# Patient Record
Sex: Female | Born: 1955 | Race: White | Hispanic: No | Marital: Married | Smoking: Never smoker
Health system: Southern US, Community
[De-identification: ages and names within clinical notes are randomized; demographics above are authoritative.]

## PROBLEM LIST (undated history)

## (undated) DIAGNOSIS — E7800 Pure hypercholesterolemia, unspecified: Secondary | ICD-10-CM

## (undated) DIAGNOSIS — K649 Unspecified hemorrhoids: Secondary | ICD-10-CM

## (undated) DIAGNOSIS — E039 Hypothyroidism, unspecified: Secondary | ICD-10-CM

## (undated) HISTORY — PX: CHOLECYSTECTOMY: SHX55

## (undated) HISTORY — DX: Hypothyroidism, unspecified: E03.9

## (undated) HISTORY — DX: Unspecified hemorrhoids: K64.9

## (undated) HISTORY — DX: Pure hypercholesterolemia, unspecified: E78.00

## (undated) HISTORY — PX: TUBAL LIGATION: SHX77

---

## 1998-05-31 ENCOUNTER — Other Ambulatory Visit: Admission: RE | Admit: 1998-05-31 | Discharge: 1998-05-31 | Payer: Self-pay | Admitting: Obstetrics and Gynecology

## 1998-11-22 ENCOUNTER — Other Ambulatory Visit: Admission: RE | Admit: 1998-11-22 | Discharge: 1998-11-22 | Payer: Self-pay | Admitting: Obstetrics and Gynecology

## 1999-08-18 ENCOUNTER — Ambulatory Visit (HOSPITAL_COMMUNITY): Admission: RE | Admit: 1999-08-18 | Discharge: 1999-08-18 | Payer: Self-pay | Admitting: Family Medicine

## 2000-11-25 ENCOUNTER — Other Ambulatory Visit: Admission: RE | Admit: 2000-11-25 | Discharge: 2000-11-25 | Payer: Self-pay | Admitting: Obstetrics and Gynecology

## 2002-01-29 ENCOUNTER — Other Ambulatory Visit: Admission: RE | Admit: 2002-01-29 | Discharge: 2002-01-29 | Payer: Self-pay | Admitting: Obstetrics and Gynecology

## 2003-02-01 ENCOUNTER — Other Ambulatory Visit: Admission: RE | Admit: 2003-02-01 | Discharge: 2003-02-01 | Payer: Self-pay | Admitting: Obstetrics and Gynecology

## 2003-03-03 ENCOUNTER — Encounter: Payer: Self-pay | Admitting: Family Medicine

## 2003-03-03 ENCOUNTER — Ambulatory Visit (HOSPITAL_COMMUNITY): Admission: RE | Admit: 2003-03-03 | Discharge: 2003-03-03 | Payer: Self-pay | Admitting: Family Medicine

## 2004-02-03 ENCOUNTER — Other Ambulatory Visit: Admission: RE | Admit: 2004-02-03 | Discharge: 2004-02-03 | Payer: Self-pay | Admitting: Obstetrics and Gynecology

## 2005-02-08 ENCOUNTER — Other Ambulatory Visit: Admission: RE | Admit: 2005-02-08 | Discharge: 2005-02-08 | Payer: Self-pay | Admitting: Obstetrics and Gynecology

## 2006-02-22 ENCOUNTER — Encounter: Admission: RE | Admit: 2006-02-22 | Discharge: 2006-02-22 | Payer: Self-pay | Admitting: Obstetrics and Gynecology

## 2010-03-06 ENCOUNTER — Ambulatory Visit (HOSPITAL_COMMUNITY): Admission: RE | Admit: 2010-03-06 | Discharge: 2010-03-06 | Payer: Self-pay | Admitting: Obstetrics and Gynecology

## 2011-03-12 LAB — CBC
HCT: 41.8 % (ref 36.0–46.0)
Hemoglobin: 14 g/dL (ref 12.0–15.0)
MCHC: 33.5 g/dL (ref 30.0–36.0)
MCV: 88 fL (ref 78.0–100.0)
Platelets: 228 10*3/uL (ref 150–400)
RBC: 4.75 MIL/uL (ref 3.87–5.11)
RDW: 12.7 % (ref 11.5–15.5)
WBC: 7.5 10*3/uL (ref 4.0–10.5)

## 2011-04-23 ENCOUNTER — Other Ambulatory Visit: Payer: Self-pay | Admitting: Obstetrics and Gynecology

## 2012-05-08 ENCOUNTER — Other Ambulatory Visit: Payer: Self-pay | Admitting: Obstetrics and Gynecology

## 2013-05-13 ENCOUNTER — Other Ambulatory Visit: Payer: Self-pay | Admitting: Obstetrics and Gynecology

## 2014-06-08 ENCOUNTER — Other Ambulatory Visit: Payer: Self-pay | Admitting: Obstetrics and Gynecology

## 2014-06-10 LAB — CYTOLOGY - PAP

## 2015-06-14 ENCOUNTER — Other Ambulatory Visit: Payer: Self-pay | Admitting: Obstetrics and Gynecology

## 2015-06-16 LAB — CYTOLOGY - PAP

## 2016-07-23 ENCOUNTER — Other Ambulatory Visit: Payer: Self-pay | Admitting: Obstetrics and Gynecology

## 2016-07-24 LAB — CYTOLOGY - PAP

## 2017-12-13 ENCOUNTER — Encounter: Payer: Self-pay | Admitting: Gynecologic Oncology

## 2017-12-13 ENCOUNTER — Ambulatory Visit: Payer: No Typology Code available for payment source | Attending: Gynecologic Oncology | Admitting: Gynecologic Oncology

## 2017-12-13 VITALS — BP 154/72 | HR 74 | Temp 98.6°F | Resp 16 | Ht 62.0 in | Wt 217.1 lb

## 2017-12-13 DIAGNOSIS — E669 Obesity, unspecified: Secondary | ICD-10-CM | POA: Diagnosis not present

## 2017-12-13 DIAGNOSIS — Z6841 Body Mass Index (BMI) 40.0 and over, adult: Secondary | ICD-10-CM

## 2017-12-13 DIAGNOSIS — E039 Hypothyroidism, unspecified: Secondary | ICD-10-CM | POA: Insufficient documentation

## 2017-12-13 DIAGNOSIS — E7800 Pure hypercholesterolemia, unspecified: Secondary | ICD-10-CM | POA: Insufficient documentation

## 2017-12-13 DIAGNOSIS — N8501 Benign endometrial hyperplasia: Secondary | ICD-10-CM

## 2017-12-13 DIAGNOSIS — K649 Unspecified hemorrhoids: Secondary | ICD-10-CM | POA: Insufficient documentation

## 2017-12-13 NOTE — Progress Notes (Signed)
Consult Note: Gyn-Onc  Consult was requested by Dr. Dareen PianoAnderson for the evaluation of Wynona CanesChristine A Herschberger 61 y.o. female  CC:  Chief Complaint  Patient presents with  . Endometrial Hyperplasia    New patient    Assessment/Plan:  Ms. Delorse LimberChristine A Shavers  is a 61 y.o.  year old with "benign" endometrial hyperplasia and obesity (BMI 40).   I discussed that complex or simple hyperplasia without atypia carry a less than 3% risk for progression to malignancy, and therefore, in the absence of symptoms, no intervention is necessary at this time. It is reasonable for her to sait until her symptoms of bleeding return and then repeat endometrial sampling at that time.  If this again shows endometrial hyperplasia I would recommend a trial of progestin therapy either orally with 80 mg of Megace twice daily or preferably placement of a progestin releasing IUD.  If she fails medical management with progestins she would be a candidate for hysterectomy.  I discussed this is the most definitive route of therapy but also carries the greatest risk particularly in women who are morbidly obese.  I discussed that the underlying etiology of her hyperplasia and abnormal uterine bleeding is her obesity.  We discussed options for weight loss including dietary options and consideration of bariatric surgery.  We have not scheduled her for follow-up at this time.  She will follow-up with either me or Dr. Dareen PianoAnderson if she develops repeat bleeding.  HPI: Darci CurrentChristine Baley is a 61 year old parous woman who was seen in consultation at the request of Dr. Dareen PianoAnderson for endometrial hyperplasia without atypia.  The patient is a morbidly obese (40 kg/m) woman who has a history of intermittent postmenopausal bleeding for the past 10 years.  She began reporting this and having it worked up in the last 12 months.  Transvaginal ultrasound scan on August 03, 2017 showed an anteverted uterus measuring 7 cm in greatest dimension with an  irregular appearance and echogenic anterior wall with a thickened endometrium.  She was then offered a office procedure of removal of endometrial polyps and biopsies with ECC on November 12, 2017 which showed on the endocervical curettings benign endometrial hyperplasia and the previous month on October 03, 2017 and endometrial biopsy showing disordered proliferative phase endometrium with focal features suggestive of polyp no atypical hyperplasia or carcinoma.  Since the office procedure to remove the polyps she has had no further bleeding.   She has hypothyroidism, elevated cholesterol and BP, and borderline diabetes.  She has had 3 SVD's, a tubal ligation and a cholecystectomy.  Current Meds:  Outpatient Encounter Medications as of 12/13/2017  Medication Sig  . atorvastatin (LIPITOR) 20 MG tablet atorvastatin 20 mg tablet  TAKE ONE TABLET BY MOUTH DAILY  . levothyroxine (SYNTHROID, LEVOTHROID) 150 MCG tablet levothyroxine 150 mcg tablet  TAKE ONE TABLET BY MOUTH DAILY  . [DISCONTINUED] aspirin 81 MG tablet aspirin 81 mg tablet  Take by oral route.   No facility-administered encounter medications on file as of 12/13/2017.     Allergy:  Allergies  Allergen Reactions  . Sulfa Antibiotics Hives  . Sulfamethoxazole-Trimethoprim Other (See Comments)    As a child not sure her reaction     Social Hx:   Social History   Socioeconomic History  . Marital status: Married    Spouse name: Not on file  . Number of children: Not on file  . Years of education: Not on file  . Highest education level: Not on file  Social Needs  .  Financial resource strain: Not on file  . Food insecurity - worry: Not on file  . Food insecurity - inability: Not on file  . Transportation needs - medical: Not on file  . Transportation needs - non-medical: Not on file  Occupational History  . Not on file  Tobacco Use  . Smoking status: Never Smoker  . Smokeless tobacco: Never Used  Substance and Sexual  Activity  . Alcohol use: Yes    Comment: occas  . Drug use: No  . Sexual activity: Yes  Other Topics Concern  . Not on file  Social History Narrative  . Not on file    Past Surgical Hx:  Past Surgical History:  Procedure Laterality Date  . CHOLECYSTECTOMY    . TUBAL LIGATION      Past Medical Hx:  Past Medical History:  Diagnosis Date  . Hemorrhoids   . Hypercholesterolemia   . Hypothyroid     Past Gynecological History:  SVD x 3 No LMP recorded.  Family Hx:  Family History  Problem Relation Age of Onset  . Colon cancer Mother   . Prostate cancer Father   . Colon cancer Paternal Aunt   . Liver cancer Maternal Grandfather   . Diabetes Paternal Grandmother     Review of Systems:  Constitutional  Feels well,    ENT Normal appearing ears and nares bilaterally Skin/Breast  No rash, sores, jaundice, itching, dryness Cardiovascular  No chest pain, shortness of breath, or edema  Pulmonary  No cough or wheeze.  Gastro Intestinal  No nausea, vomitting, or diarrhoea. No bright red blood per rectum, no abdominal pain, change in bowel movement, or constipation.  Genito Urinary  No frequency, urgency, dysuria, + postmenopausal bleeding Musculo Skeletal  No myalgia, arthralgia, joint swelling or pain  Neurologic  No weakness, numbness, change in gait,  Psychology  No depression, anxiety, insomnia.   Vitals:  Blood pressure (!) 154/72, pulse 74, temperature 98.6 F (37 C), temperature source Oral, resp. rate 16, height 5\' 2"  (1.575 m), weight 217 lb 1.6 oz (98.5 kg), SpO2 100 %.  Physical Exam: WD in NAD Neck  Supple NROM, without any enlargements.  Lymph Node Survey No cervical supraclavicular or inguinal adenopathy Cardiovascular  Pulse normal rate, regularity and rhythm. S1 and S2 normal.  Lungs  Clear to auscultation bilateraly, without wheezes/crackles/rhonchi. Good air movement.  Skin  No rash/lesions/breakdown  Psychiatry  Alert and oriented to  person, place, and time  Abdomen  Normoactive bowel sounds, abdomen soft, non-tender and obese without evidence of hernia.  Back No CVA tenderness Genito Urinary  Vulva/vagina: Normal external female genitalia.   No lesions. No discharge or bleeding.  Bladder/urethra:  No lesions or masses, well supported bladder  Vagina: normal, no blood  Cervix: Normal appearing, no lesions.  Uterus:  Small, mobile, no parametrial involvement or nodularity.  Adnexa: no palpable masses. Rectal  deferred Extremities  No bilateral cyanosis, clubbing or edema.   Quinn Axeossi, Crysten Kaman Caroline, MD  12/13/2017, 1:49 PM

## 2017-12-13 NOTE — Patient Instructions (Signed)
No intervention recommended at this time given that you don't have bleeding symptoms. Weight loss will help prevent repeated hyperplasia and reduce the risk of cancer formation.  If you develop new vaginal bleeding please see Dr Dareen PianoAnderson or Dr Andrey Farmerossi and have repeated sampling of the uterus. If the hyperplasia has returned, you should trial progestin therapy either as a tablet or IUD. If this does not improve the bleeding, you would be considered for hysterectomy.  Dr Oliver Humossi's office number is [phone].

## 2018-01-24 ENCOUNTER — Telehealth: Payer: Self-pay | Admitting: *Deleted

## 2018-01-24 NOTE — Telephone Encounter (Signed)
[facility] have faxed the 12/28 office note to Tia.

## 2018-03-17 HISTORY — PX: DILATION AND CURETTAGE, DIAGNOSTIC / THERAPEUTIC: SUR384

## 2018-04-29 ENCOUNTER — Other Ambulatory Visit: Payer: Self-pay | Admitting: Obstetrics and Gynecology

## 2018-05-07 ENCOUNTER — Other Ambulatory Visit: Payer: Self-pay | Admitting: Obstetrics and Gynecology

## 2018-05-13 ENCOUNTER — Encounter (HOSPITAL_BASED_OUTPATIENT_CLINIC_OR_DEPARTMENT_OTHER): Payer: Self-pay

## 2018-05-13 ENCOUNTER — Ambulatory Visit (HOSPITAL_BASED_OUTPATIENT_CLINIC_OR_DEPARTMENT_OTHER): Admit: 2018-05-13 | Payer: No Typology Code available for payment source | Admitting: Obstetrics and Gynecology

## 2018-05-13 SURGERY — DILATATION AND CURETTAGE /HYSTEROSCOPY
Anesthesia: Choice

## 2018-05-19 ENCOUNTER — Telehealth: Payer: Self-pay | Admitting: *Deleted

## 2018-05-19 NOTE — Telephone Encounter (Signed)
[facility] and scheduled a follow up appt for the patient. Patient had d&c done on 5/22 still having bleeding, benign results. Dr.Phelps to see the June 26th at 10:45am, patient on a cruise from 6/4 thru 6/22.

## 2018-06-10 NOTE — Progress Notes (Signed)
Progress Note: GYN-ONC Established Patient Follow-up  Consult was initally requested by Dr. Dareen Piano  CC:  Chief Complaint  Patient presents with  . Benign endometrial hyperplasia    HPI Theresa Castaneda  is a 62 y.o. year old with "benign" endometrial hyperplasia and obesity (BMI 40).  Interval History Since her last visit with Dr. Andrey Farmer she had stopped bleeding for about 6 months but then had ~1.5 months of bleeding with some days as heavy as a menstrual cycle. Dr. Dareen Piano attempted EMB in office but it sounds like it showed inadequate tissue. She was then taken for [facility] D&C 05/07/18 and findings showed some anterior projections into the cavity possibly fibroids, but Dr. Dareen Piano felt the tissue adequacy was suboptimal. The pathology did show secretory endometrium and some areas suggestive of  hyperplasia, due to "squamous morules"; however extensive fragmentation and breakdown prevented accurate assessment for such. There are no atypia or malignant features.  Given her prior episode of bleeding and the concern as to the etiology of the anterior lesions she was sent for recommendations. Dr Dareen Piano is concerned she may be difficult to sample in the office going forward.  She has intentionally lost 7 pounds since her visit with Dr. Andrey Farmer 11/2017. She is walking more now that she is retired. Her and her husband are trying to incorporate more routine physical activity.     Presenting History The patient is a morbidly obese (40 kg/m) woman who has a history of intermittent postmenopausal bleeding for the past 10 years.  She began reporting this and having it worked up a year prior to presenting to [facility]  11/2017. Transvaginal ultrasound scan on August 03, 2017 showed an anteverted uterus measuring 7 cm in greatest dimension with an irregular appearance and echogenic anterior wall with a thickened endometrium.  October 03, 2017 an endometrial biopsy showed disordered proliferative  phase endometrium with focal features suggestive of polyp no atypical hyperplasia or carcinoma. She was then offered a office procedure of removal of endometrial polyps and biopsies with ECC on November 12, 2017 which showed on the endocervical curettings benign endometrial hyperplasia   Dr. Andrey Farmer discussed that complex or simple hyperplasia without atypia carry a less than 3% risk for progression to malignancy, and therefore, in the absence of symptoms, no intervention was necessary.  Plan was if symptoms of bleeding returned then repeat endometrial sampling. If this again this had shown endometrial hyperplasia recommendation was for a trial of progestin therapy either orally with 80 mg of Megace twice daily or preferably placement of a progestin releasing IUD.  If she failed medical management with progestins she would be a candidate for hysterectomy.    Dr. Andrey Farmer specifically discussed the underlying etiology of her hyperplasia and abnormal uterine bleeding is her obesity.  She reviewed options for weight loss including dietary options and consideration of bariatric surgery.  She was left with open followup in the event she developed further bleeding that required our input.    She has hypothyroidism, elevated cholesterol and BP, and borderline diabetes.  She has had 3 SVD's, a tubal ligation and a cholecystectomy.  Current Meds:  Outpatient Encounter Medications as of 06/11/2018  Medication Sig  . atorvastatin (LIPITOR) 20 MG tablet atorvastatin 20 mg tablet  TAKE ONE TABLET BY MOUTH DAILY  . levothyroxine (SYNTHROID, LEVOTHROID) 150 MCG tablet levothyroxine 150 mcg tablet  TAKE ONE TABLET BY MOUTH DAILY  . naproxen (NAPROSYN) 500 MG tablet Take 500 mg by mouth 2 (two) times daily  with a meal.   No facility-administered encounter medications on file as of 06/11/2018.     Allergy:  Allergies  Allergen Reactions  . Sulfa Antibiotics Hives  . Sulfamethoxazole-Trimethoprim Other (See  Comments)    As a child not sure her reaction     Social Hx:   Tobacco use: None ETOH use : occasional Illicit Drug use: None H/o IV Drug use: None  Past Surgical Hx:  Past Surgical History:  Procedure Laterality Date  . CHOLECYSTECTOMY    . DILATION AND CURETTAGE, DIAGNOSTIC / THERAPEUTIC  03/2018  . TUBAL LIGATION      Past Medical Hx:  Past Medical History:  Diagnosis Date  . Hemorrhoids   . Hypercholesterolemia   . Hypothyroid     Past Gynecological History:  SVD x 3 No LMP recorded.  Family Hx:  Family History  Problem Relation Age of Onset  . Colon cancer Mother   . Prostate cancer Father   . Colon cancer Paternal Aunt   . Liver cancer Maternal Grandfather   . Diabetes Paternal Grandmother     Review of Systems: Review of Systems  Endocrine: Positive for hot flashes.  Genitourinary: Positive for vaginal bleeding.   All other systems reviewed and are negative.    Vitals:  Blood pressure 135/67, pulse 83, temperature 98.4 F (36.9 C), temperature source Oral, resp. rate 18, height 5\' 2"  (1.575 m), weight 210 lb 14.4 oz (95.7 kg), SpO2 100 %. Body mass index is 38.57 kg/m.   Physical Exam: ECOG PERFORMANCE STATUS: 1 - Symptomatic but completely ambulatory   General :  Well developed, 62 y.o., female in no apparent distress HEENT:  Normocephalic/atraumatic, symmetric, EOMI, eyelids normal Neck:   No visible masses.  Respiratory:  Respirations unlabored, no use of accessory muscles CV:   Deferred Breast:  Deferred Musculoskeletal: Normal muscle strength. Abdomen:  No visible masses or protrusion Extremities:  No visible edema or deformities Skin:   Normal inspection Neuro/Psych:  No focal motor deficit, no abnormal mental status. Normal gait. Normal affect. Alert and oriented to person, place, and time  Pelvic:  Deferred by patient request.   ASSESSMENT / PLAN 1. Postmenopausal bleeding with prior simple hyperplasia, now showing secretory  endometrium  Still would recommend progesterone. Favor IUD if she can afford it.  Discussed risks of oral progesterone, but given this was simple hyperplasia before and now reading as secretory could use lower dose progesterone options  I would not be concerned about trying to resample her once the IUD is placed  Discussed hysterectomy. Patient trying to avoid surgery. 2. Obesity  Re-reviewed this is the underlying issue that she needs to work on.  I encouraged her to continue her efforts at weight loss.  She does not want bariatric surgery 3. Anterior projections on [facility]  May be small fibroids, but I think Dr. Dareen Piano sampled sufficiently to rule out endometrial pathology  If bleeding with IUD we might need to consider etiology other than fibroids. 4. I explained the above recommendations and thoughts to the patient's satisfaction. 5. Return if bleeding once IUD has been in place for reasonable amount of time. 6. I will copy Dr. Andrey Farmer as she had originally seen the patient and there is a personal connection the patient mentioned to me today. 7. I'll need to call Dr. Dareen Piano to review above.  Face to face time with patient was 30 minutes. Over 50% of this time was spent on counseling and coordination of care.   Artist Pais  Waynetta SandyB Jaisha Villacres, MD  06/11/2018, 1:39 PM  Cc: Malva LimesMark Anderson, MD (Referring Ob/Gyn) Arvella MerlesW. Randall Harris, MD (PCP)

## 2018-06-11 ENCOUNTER — Inpatient Hospital Stay: Payer: No Typology Code available for payment source | Attending: Obstetrics | Admitting: Obstetrics

## 2018-06-11 ENCOUNTER — Encounter: Payer: Self-pay | Admitting: Obstetrics

## 2018-06-11 VITALS — BP 135/67 | HR 83 | Temp 98.4°F | Resp 18 | Ht 62.0 in | Wt 210.9 lb

## 2018-06-11 DIAGNOSIS — N8501 Benign endometrial hyperplasia: Secondary | ICD-10-CM

## 2018-06-11 DIAGNOSIS — Z6841 Body Mass Index (BMI) 40.0 and over, adult: Secondary | ICD-10-CM | POA: Insufficient documentation

## 2018-06-11 DIAGNOSIS — E669 Obesity, unspecified: Secondary | ICD-10-CM

## 2018-06-11 NOTE — Patient Instructions (Signed)
1. Open followup should you desire. 2. I will call Dr. Dareen PianoAnderson with recommendations. 3. Continue weight loss efforts.

## 2018-08-11 ENCOUNTER — Other Ambulatory Visit: Payer: Self-pay | Admitting: Obstetrics and Gynecology

## 2018-08-11 DIAGNOSIS — R928 Other abnormal and inconclusive findings on diagnostic imaging of breast: Secondary | ICD-10-CM

## 2018-08-14 ENCOUNTER — Other Ambulatory Visit: Payer: No Typology Code available for payment source

## 2018-08-19 ENCOUNTER — Other Ambulatory Visit: Payer: No Typology Code available for payment source

## 2018-08-21 ENCOUNTER — Ambulatory Visit
Admission: RE | Admit: 2018-08-21 | Discharge: 2018-08-21 | Disposition: A | Discharge status: Home / Self Care | Payer: PRIVATE HEALTH INSURANCE | Source: Ambulatory Visit | Attending: Obstetrics and Gynecology | Admitting: Obstetrics and Gynecology

## 2018-08-21 ENCOUNTER — Ambulatory Visit
Admission: RE | Admit: 2018-08-21 | Discharge: 2018-08-21 | Disposition: A | Discharge status: Home / Self Care | Payer: No Typology Code available for payment source | Source: Ambulatory Visit | Attending: Obstetrics and Gynecology | Admitting: Obstetrics and Gynecology

## 2018-08-21 DIAGNOSIS — R928 Other abnormal and inconclusive findings on diagnostic imaging of breast: Secondary | ICD-10-CM

## 2020-08-24 IMAGING — MG DIGITAL DIAGNOSTIC UNILATERAL LEFT MAMMOGRAM WITH TOMO AND CAD
4 series · 4 of 12 positions shown · non-contrast
Comparison: Previous exam(s).

CLINICAL DATA: The patient was called back for a mass in the left
breast.

EXAM:
DIGITAL DIAGNOSTIC LEFT MAMMOGRAM WITH CAD AND TOMO
ULTRASOUND LEFT BREAST

[L MLO synth-2D]
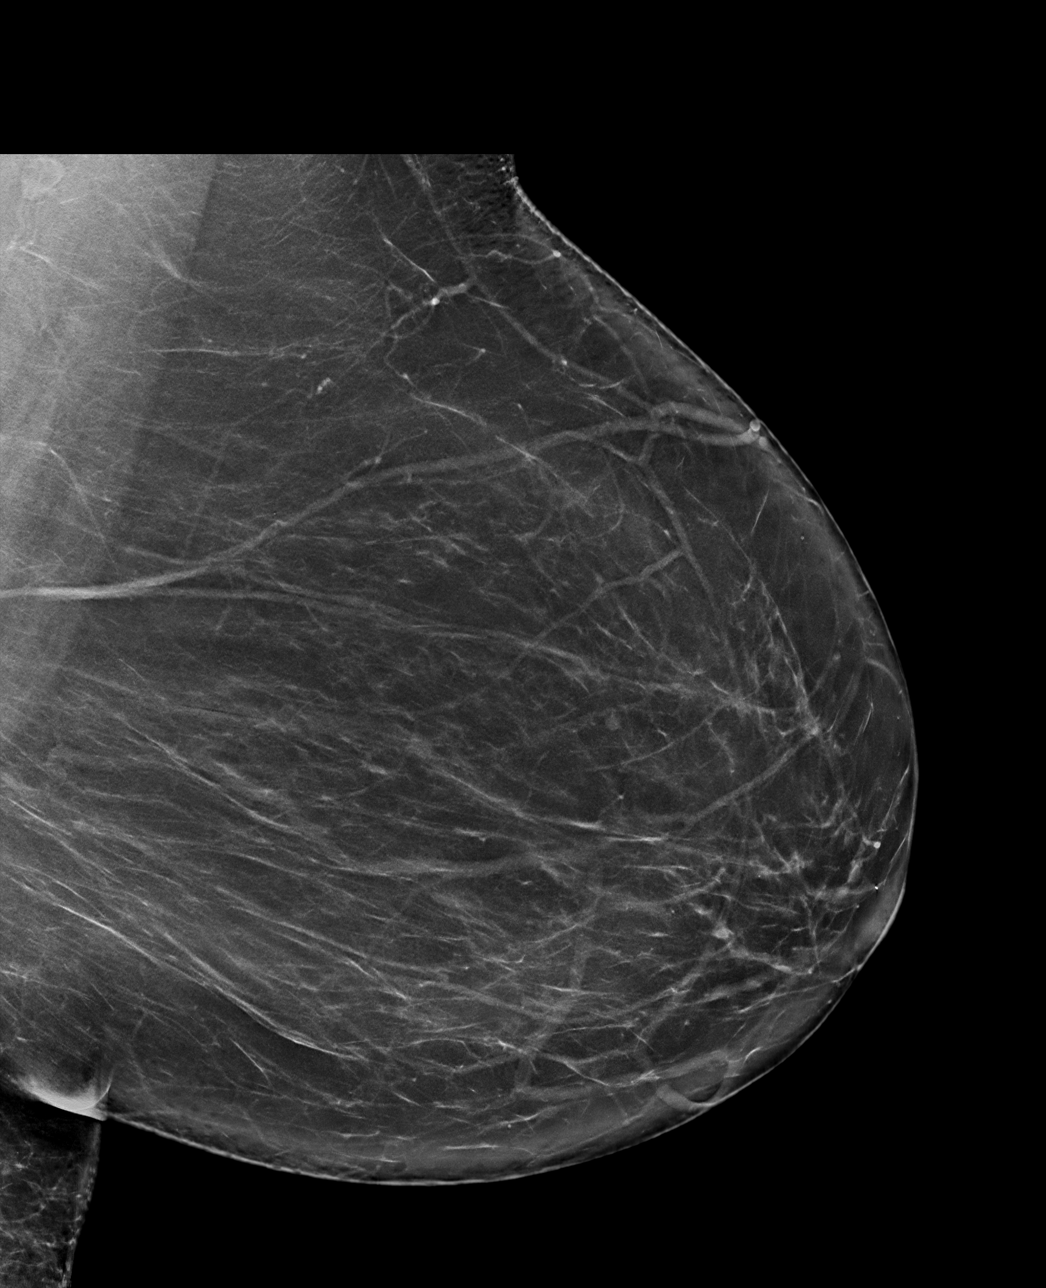

[L CC synth-2D]
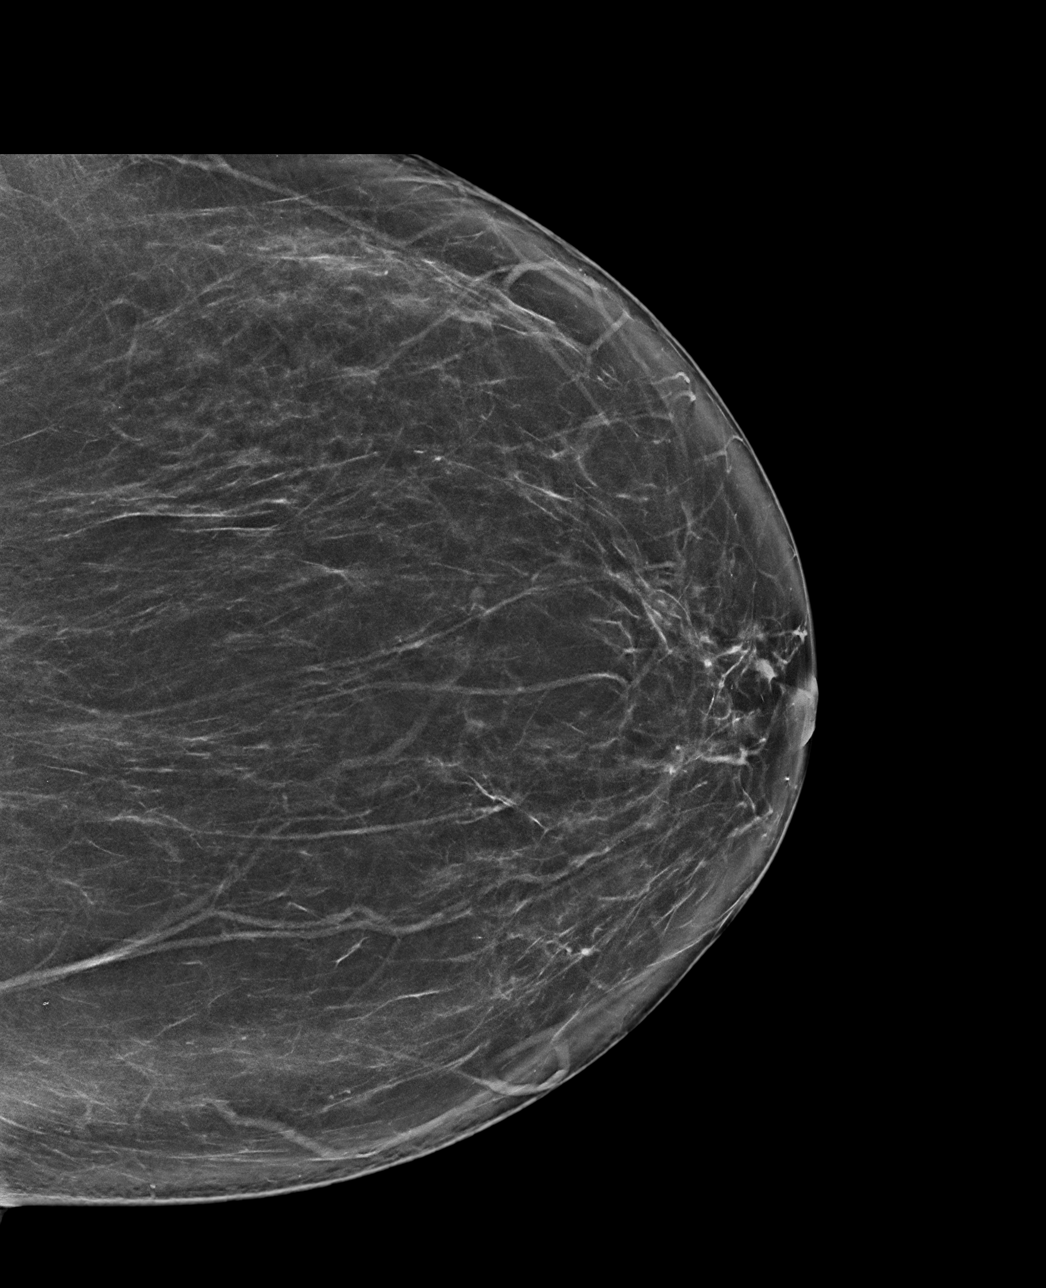

[L CC tomo · tomo slice 41/82.0]
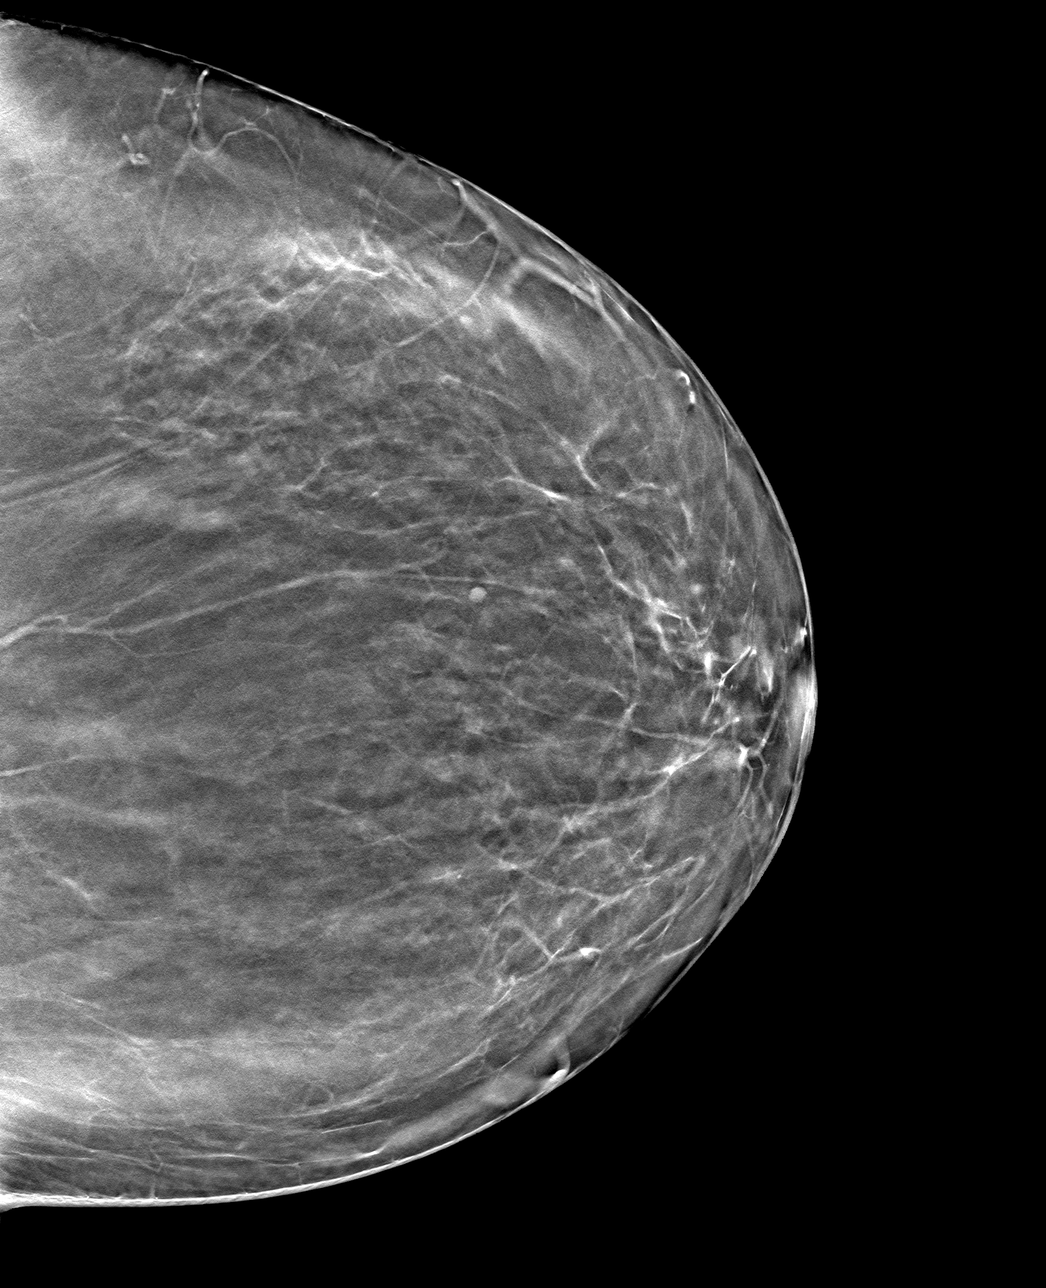

[L MLO tomo · tomo slice 47/92.0]
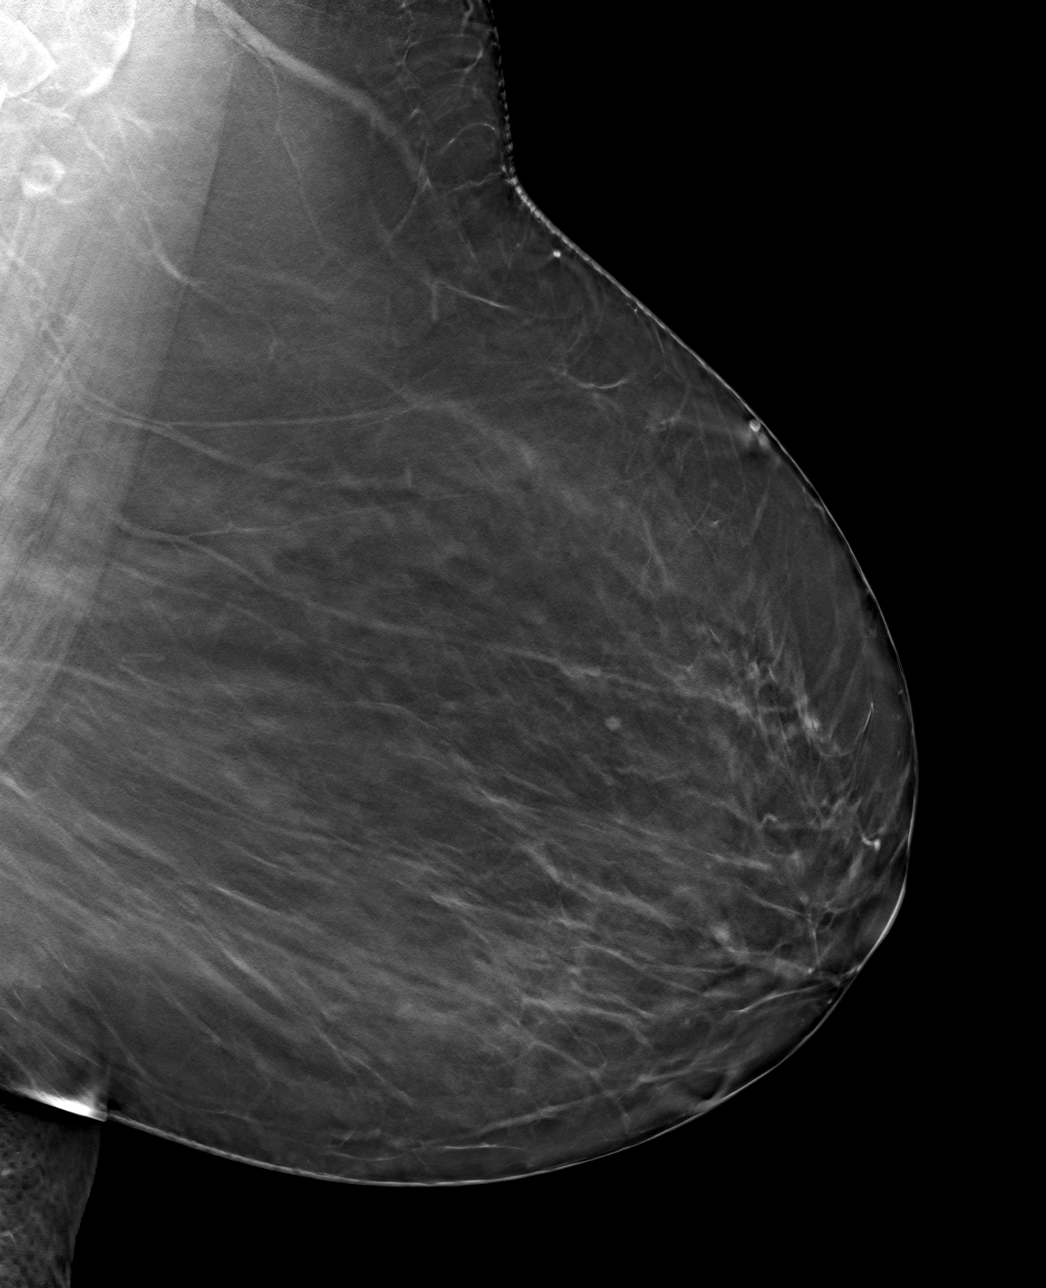

[4 of 12 positions shown; findings below may reference images not displayed]

ACR Breast Density Category b: There are scattered areas of
fibroglandular density.
FINDINGS: The mass in the superolateral left breast at an anterior to middle
depth persists. No other suspicious findings.

Mammographic images were processed with CAD.

On physical exam, no suspicious lumps are identified.

Targeted ultrasound is performed, showing a tiny cyst in the lateral
left breast accounting for the mammographic finding.
IMPRESSION: No mammographic or sonographic evidence of malignancy. Tiny cyst in
the left breast.

RECOMMENDATION:
Annual screening mammography.

I have discussed the findings and recommendations with the patient.
Results were also provided in writing at the conclusion of the
visit. If applicable, a reminder letter will be sent to the patient
regarding the next appointment.

BI-RADS CATEGORY  2: Benign.
